# Patient Record
Sex: Female | Born: 1965 | Race: White | Hispanic: No | Marital: Married | Smoking: Never smoker
Health system: Southern US, Community
[De-identification: ages and names within clinical notes are randomized; demographics above are authoritative.]

## PROBLEM LIST (undated history)

## (undated) DIAGNOSIS — R51 Headache: Secondary | ICD-10-CM

## (undated) DIAGNOSIS — R519 Headache, unspecified: Secondary | ICD-10-CM

## (undated) HISTORY — PX: ABDOMINAL HYSTERECTOMY: SHX81

## (undated) HISTORY — DX: Headache: R51

## (undated) HISTORY — DX: Headache, unspecified: R51.9

---

## 2006-12-06 ENCOUNTER — Ambulatory Visit: Payer: Self-pay | Admitting: Gastroenterology

## 2006-12-09 ENCOUNTER — Ambulatory Visit: Payer: Self-pay | Admitting: Gastroenterology

## 2006-12-09 ENCOUNTER — Encounter (INDEPENDENT_AMBULATORY_CARE_PROVIDER_SITE_OTHER): Payer: Self-pay | Admitting: *Deleted

## 2006-12-24 ENCOUNTER — Ambulatory Visit: Payer: Self-pay | Admitting: Gastroenterology

## 2006-12-24 LAB — CONVERTED CEMR LAB
Basophils Absolute: 0 10*3/uL (ref 0.0–0.1)
Basophils Relative: 0.3 % (ref 0.0–1.0)
CRP, High Sensitivity: <1 — ABNORMAL LOW (ref 0.00–5.00)
Eosinophils Absolute: 0.2 10*3/uL (ref 0.0–0.6)
Eosinophils Relative: 1.9 % (ref 0.0–5.0)
HCT: 38.6 % (ref 36.0–46.0)
Hemoglobin: 13.8 g/dL (ref 12.0–15.0)
Lymphocytes Relative: 36.9 % (ref 12.0–46.0)
MCHC: 35.7 g/dL (ref 30.0–36.0)
MCV: 88.9 fL (ref 78.0–100.0)
Monocytes Absolute: 0.6 10*3/uL (ref 0.2–0.7)
Monocytes Relative: 6.9 % (ref 3.0–11.0)
Neutro Abs: 4.2 10*3/uL (ref 1.4–7.7)
Neutrophils Relative %: 54 % (ref 43.0–77.0)
Platelets: 269 10*3/uL (ref 150–400)
RBC: 4.34 M/uL (ref 3.87–5.11)
RDW: 11.6 % (ref 11.5–14.6)
Sed Rate: 10 mm/hr (ref 0–25)
WBC: 8 10*3/uL (ref 4.5–10.5)

## 2006-12-25 ENCOUNTER — Ambulatory Visit: Payer: Self-pay | Admitting: Gastroenterology

## 2007-02-12 ENCOUNTER — Ambulatory Visit (HOSPITAL_COMMUNITY): Admission: RE | Admit: 2007-02-12 | Discharge: 2007-02-13 | Payer: Self-pay | Admitting: Obstetrics and Gynecology

## 2007-02-12 ENCOUNTER — Encounter (INDEPENDENT_AMBULATORY_CARE_PROVIDER_SITE_OTHER): Payer: Self-pay | Admitting: Specialist

## 2009-12-27 DEATH — deceased

## 2009-12-28 ENCOUNTER — Encounter: Admission: RE | Admit: 2009-12-28 | Discharge: 2009-12-28 | Payer: Self-pay | Admitting: Obstetrics and Gynecology

## 2010-12-06 ENCOUNTER — Observation Stay (HOSPITAL_COMMUNITY)
Admission: AD | Admit: 2010-12-06 | Discharge: 2010-12-07 | Payer: Self-pay | Source: Home / Self Care | Attending: Obstetrics and Gynecology | Admitting: Obstetrics and Gynecology

## 2010-12-06 DIAGNOSIS — R1032 Left lower quadrant pain: Secondary | ICD-10-CM

## 2010-12-06 DIAGNOSIS — N8353 Torsion of ovary, ovarian pedicle and fallopian tube: Secondary | ICD-10-CM

## 2010-12-11 LAB — CBC
HCT: 40.7 % (ref 36.0–46.0)
HCT: 35.1 % — ABNORMAL LOW (ref 36.0–46.0)
Hemoglobin: 14.7 g/dL (ref 12.0–15.0)
Hemoglobin: 12.8 g/dL (ref 12.0–15.0)
MCH: 31.3 pg (ref 26.0–34.0)
MCH: 31.8 pg (ref 26.0–34.0)
MCHC: 36.1 g/dL — ABNORMAL HIGH (ref 30.0–36.0)
MCHC: 36.5 g/dL — ABNORMAL HIGH (ref 30.0–36.0)
MCV: 86.6 fL (ref 78.0–100.0)
MCV: 87.1 fL (ref 78.0–100.0)
Platelets: 222 10*3/uL (ref 150–400)
Platelets: 197 10*3/uL (ref 150–400)
RBC: 4.7 MIL/uL (ref 3.87–5.11)
RBC: 4.03 MIL/uL (ref 3.87–5.11)
RDW: 12.1 % (ref 11.5–15.5)
RDW: 12.2 % (ref 11.5–15.5)
WBC: 13.7 10*3/uL — ABNORMAL HIGH (ref 4.0–10.5)
WBC: 13.8 10*3/uL — ABNORMAL HIGH (ref 4.0–10.5)

## 2010-12-11 LAB — COMPREHENSIVE METABOLIC PANEL
ALT: 18 U/L (ref 0–35)
AST: 21 U/L (ref 0–37)
Albumin: 4.8 g/dL (ref 3.5–5.2)
Alkaline Phosphatase: 62 U/L (ref 39–117)
BUN: 8 mg/dL (ref 6–23)
CO2: 26 mEq/L (ref 19–32)
Calcium: 9.9 mg/dL (ref 8.4–10.5)
Chloride: 104 mEq/L (ref 96–112)
Creatinine, Ser: 0.81 mg/dL (ref 0.4–1.2)
GFR calc Af Amer: >60 mL/min (ref >60)
GFR calc non Af Amer: >60 mL/min (ref >60)
Glucose, Bld: 113 mg/dL — ABNORMAL HIGH (ref 70–99)
Potassium: 4.3 mEq/L (ref 3.5–5.1)
Sodium: 137 mEq/L (ref 135–145)
Total Bilirubin: 1 mg/dL (ref 0.3–1.2)
Total Protein: 8 g/dL (ref 6.0–8.3)

## 2010-12-11 LAB — URINALYSIS, ROUTINE W REFLEX MICROSCOPIC
Bilirubin Urine: NEGATIVE
Hgb urine dipstick: NEGATIVE
Ketones, ur: 15 mg/dL — AB
Nitrite: NEGATIVE
Protein, ur: NEGATIVE mg/dL
Specific Gravity, Urine: 1.025 (ref 1.005–1.030)
Urine Glucose, Fasting: NEGATIVE mg/dL
Urobilinogen, UA: 0.2 mg/dL (ref 0.0–1.0)
pH: 6 (ref 5.0–8.0)

## 2010-12-19 NOTE — Discharge Summary (Signed)
  NAMEMarland Kitchen  Pam Diaz, Pam Diaz NO.:  [id number]  MEDICAL RECORD NO.:  [id number]          PATIENT TYPE:  OBV  LOCATION:  9304                          FACILITY:  [facility]  PHYSICIAN:  Guy Sandifer. Henderson Cloud, M.D. DATE OF BIRTH:  1966-02-04  DATE OF ADMISSION:  12/06/2010 DATE OF DISCHARGE:  12/07/2010                              DISCHARGE SUMMARY   ADMITTING DIAGNOSIS:  Left lower quadrant pain.  DISCHARGE DIAGNOSES: 1. Left lower quadrant pain. 2. Left ovarian cyst.  REASON FOR ADMISSION:  This patient is a 45 year old married white female status post LAVH in 2008, who had an abrupt onset of left lower quadrant pain in the early a.m. of December 06, 2010.  She presented to the office with severe left lower quadrant pain.  She was transferred to [facility].  Vital signs were stable.  She was afebrile. Hemoglobin was 14.7.  White count 13.7.  Metabolic panel was within normal limits and the urinalysis was normal as well.  Ultrasound revealed positive blood flow to both ovaries.  There was a question of relatively less flow to the left ovary although there was no absence of flow noted.  She was admitted overnight for observation and pain medication.  HOSPITAL COURSE:  The patient was given pain medication upon initial presentation to [facility].  She required no further pain medicines throughout her stay.  She was observed until about lunchtime the following day, at which time she was comfortable, tolerating a regular diet, passing flatus.  Her abdomen was soft with only minimal tenderness in the left lower quadrant with no masses or rebound noted. Repeat CBC on December 07, 2010, revealed a white count of 13.6, hemoglobin of 12.8, and she remained afebrile.  CONDITION ON DISCHARGE:  Good.  DIET:  Regular as tolerated.  ACTIVITY:  No heavy lifting.  She is to call the office for problems including recurrent severe pain, nausea, vomiting, or  fever.  MEDICATIONS: 1. Vicodin #20, 1-2 p.o. q.6 hours p.r.n. 2. Ibuprofen 600 mg q.6 hours p.r.n.  FOLLOWUP:  Followup is in the office in 3-4 weeks.     Guy Sandifer Henderson Cloud, M.D.     JET/MEDQ  D:  12/13/2010  T:  12/13/2010  Job:  [id number]  Electronically Signed by Harold Hedge M.D. on 12/19/2010 08:47:17 AM

## 2010-12-28 ENCOUNTER — Other Ambulatory Visit (HOSPITAL_COMMUNITY): Payer: Self-pay

## 2011-01-02 ENCOUNTER — Ambulatory Visit (HOSPITAL_COMMUNITY)
Admission: RE | Admit: 2011-01-02 | Payer: BC Managed Care – PPO | Source: Home / Self Care | Admitting: Obstetrics and Gynecology

## 2011-04-13 NOTE — Assessment & Plan Note (Signed)
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 Henderson Cloud for evaluation of lower  abdominal pain.   HISTORY OF PRESENT ILLNESS:  Pam Diaz has been in good health all her  life without serious medical or surgical problems.  For the last 5 years  she has had rather classic irritable bowel syndrome with alternating  diarrhea and constipation with crampy lower abdominal pain.  This has  been more severe over the last several months and she has had almost  daily crampy lower abdominal pain, mostly with constipation.  She has  had no rectal bleeding, nausea and vomiting, or systemic complaints such  as arthritis, skin rash, joint pains, etc.  She has never had barium  studies or endoscopic exams.  She did have an umbilical hernia repair in  1998.   She recently saw Dr. Henderson Cloud and apparently was felt to possibly have  endometriosis and scheduled for pelvic ultrasound in February.  Laboratory data was checked in his office, which is not available at  this time.   The patient follows a regular diet and has no specific food  intolerances.  She does have some mild nausea and occasionally has  tenesmus and rectal spasms.  She denies abuse of NSAIDs or other  antiinflammatories.   PAST MEDICAL HISTORY:  Otherwise noncontributory.   FAMILY HISTORY:  Remarkable for diabetes in her grandmother, otherwise  unremarkable.   MEDICATIONS:  None except multivitamins.  She denies drug allergies.   SOCIAL HISTORY:  She is married and lives with her husband and three  children.  She has a degree in accounting.  She does not smoke or use  ethanol.   REVIEW OF SYSTEMS:  Noncontributory except for some  nonspecific  occasional night sweats.  She denies any rheumatologic symptoms or  symptoms of collagen vascular disease.  She has had no upper GI or  hepatobiliary complaints.   EXAMINATION:  GENERAL:  Shows her to be an attractive, healthy-appearing  white female appearing her stated age.  She is 5 feet 7 inches tall and  weighs 180 pounds.  Blood pressure is 120/70 and pulse is 65 and  regular.  There is no thyromegaly or lymphadenopathy noted.  I could not  appreciate stigmata of chronic liver disease.  CHEST:  Entirely clear and she was in a regular rhythm without murmurs,  gallops or rubs.  ABDOMEN:  Exam showed no organomegaly, masses, tenderness, or  distention.  Bowel sounds were normal.  PERIPHERAL EXTREMITIES:  Unremarkable.  RECTUM:  Inspection was unremarkable without fissures or fistulae.  Rectal exam showed no masses or tenderness.  Stool was trace guaiac  positive.   ASSESSMENT:  I think that there is a good chance this patient has  endometrial involvement of sigmoid colon accounting for rather severe  crampy lower abdominal pain and  change of bowel habits.  She gives a  long history of symptoms most consistent with irritable bowel syndrome,  and it certainly possible she has underlying inflammatory bowel disease.   RECOMMENDATIONS:  I have gone ahead and set Pam Diaz up for colonoscopy  exam next week at her convenience.  I have given her some  anticholinergic-antispasmodic medicine to use in the interim, along with  some fiber supplements.  I will ask Dr. Henderson Cloud to send [place] a copy of  recent laboratory data.  We may need to move her pelvic ultrasound exam  up sooner depending on her colonoscopy results.     Vania Rea. Jarold Motto, MD, Caleen Essex, FAGA  Electronically Signed    DRP/MedQ  DD: 12/06/2006  DT: 12/06/2006  Job #: [id number]   cc:   Guy Sandifer. Henderson Cloud, M.D.

## 2011-04-13 NOTE — H&P (Signed)
Pam Diaz, Pam Diaz NO.:  [id number]   MEDICAL RECORD NO.:  [id number]          PATIENT TYPE:  AMB   LOCATION:  SDC                           [facility]ILITY:  [facility]   PHYSICIAN:  Guy Sandifer. Henderson Cloud, M.D. DATE OF BIRTH:  07-22-1966   DATE OF ADMISSION:  02/12/2007  DATE OF DISCHARGE:                              HISTORY & PHYSICAL   CHIEF COMPLAINT:  Heavy, painful menses.   HISTORY OF PRESENT ILLNESS:  This patient is a 45 year old married white  female, G4, P3, husband status post vasectomy, with increasingly heavy  and painful menses.  Her pain begins several days before the menses  accompanied by low back pain, loose stools and cramping.  It is getting  worse each month.  Her flow is becoming heavier and she will have 2-3  days of cycle changing a tampon every hour.  Ultrasound in my office on  December 30, 2006, revealed the uterus to measure 10.0 x 5.8 x 6.4 cm  with multiple polypoid masses in the endometrial cavity ranging from 5  to 14 mm.  After discussing options of management, the patient is being  admitted for laparoscopically-assisted vaginal hysterectomy and removal  of one or both ovaries if distinctly abnormal.  The potential risks and  complications of the procedure have been discussed preoperatively.   PAST MEDICAL HISTORY:  Negative.   PAST SURGICAL HISTORY:  Umbilical hernia repair.   OBSTETRICAL HISTORY:  Vaginal delivery x3, miscarriage x1.   MEDICATIONS:  None.   No known drug allergies   SOCIAL HISTORY:  Denies tobacco, alcohol or drug abuse.   FAMILY HISTORY:  Diabetes in grandmother.  High blood pressure in  father.  Reflux disease in her father.  Anemia in sister.  Endometriosis  in sister.   REVIEW OF SYSTEMS:  NEUROLOGIC:  Denies headache.  CARDIAC:  No chest  pain.  PULMONARY:  No shortness of breath.  GI:  Denies recent changes  in bowel habits.   PHYSICAL EXAMINATION:  VITAL SIGNS:  Height 5 feet 6-3/4 inches, weight  181.4  pounds.  Blood pressure 110/80.  HEENT:  Without thyromegaly.  LUNGS:  Clear to auscultation.  HEART: Regular rate and rhythm.  BACK:  Without CVA tenderness.  BREASTS:  Without masses, retraction or discharge.  ABDOMEN:  Soft, nontender, without masses.  PELVIC:  The uterus is 8 weeks in size, mobile, nontender.  Adnexa  nontender without masses.  EXTREMITIES:  Grossly within normal limits.  NEUROLOGIC:  Grossly within normal limits.   ASSESSMENT:  Dysmenorrhea and menorrhagia.   PLAN:  Laparoscopically-assisted vaginal hysterectomy and removal of one  or both ovaries if abnormal.      Guy Sandifer. Henderson Cloud, M.D.  Electronically Signed     JET/MEDQ  D:  02/04/2007  T:  02/04/2007  Job:  [id number]

## 2011-04-13 NOTE — Assessment & Plan Note (Signed)
[facility]                         GASTROENTEROLOGY OFFICE NOTE   Pam Diaz, Pam Diaz                    MRN:          [id number]  DATE:12/24/2006                            DOB:          05-17-1966    Pam Diaz continued with abdominal pain in various quadrants of a spasmodic  type nature with gas and bloating but denies bowel irregularity.  She  has a long history of IBS and had a negative colonoscopy on December 09, 2006.   Vital signs and abdominal exam are today entirely normal.   ASSESSMENT:  This patient has irritable bowel syndrome and perhaps  pelvic endometriosis.   RECOMMENDATIONS:  1. P.R.N. sublingual Levsin 1.25 mg every 6 to 8 hours.  2. Upper abdominal ultrasound exam.  3. P.R.N. GI followup as needed.     Vania Rea. Jarold Motto, MD, Caleen Essex, FAGA  Electronically Signed    DRP/MedQ  DD: 12/24/2006  DT: 12/24/2006  Job #: [id number]   cc:   Guy Sandifer. Henderson Cloud, M.D.

## 2011-04-13 NOTE — Op Note (Signed)
NAMEKIELEY, AKTER NO.:  [id number]   MEDICAL RECORD NO.:  [id number]          PATIENT TYPE:  AMB   LOCATION:  SDC                           [facility]  [facility]   PHYSICIAN:  Guy Sandifer. Henderson Cloud, M.D. DATE OF BIRTH:  09/10/1966   DATE OF PROCEDURE:  02/12/2007  DATE OF DISCHARGE:                               OPERATIVE REPORT   PREOPERATIVE DIAGNOSIS:  Menorrhagia.   POSTOPERATIVE DIAGNOSIS:  Menorrhagia.   PROCEDURE:  Laparoscopically-assisted vaginal hysterectomy.   SURGEON:  Harold Hedge, M.D.   ASSISTANT:  Zelphia Cairo, M.D.   ANESTHESIA:  General with endotracheal intubation.   SPECIMENS:  Uterus sent to pathology.   ESTIMATED BLOOD LOSS:  250 mL.   INDICATIONS AND CONSENT:  This patient is a 45 year old married white  female, G4, P3, husband status post vasectomy, with increasingly heavy  and painful menses.  Details are dictated in the history and physical.  Laparoscopically-assisted vaginal hysterectomy with removal of one or  both ovaries if abnormal has been discussed preoperatively.  Potential  risks and complications were reviewed preoperatively including but not  limited to infection, bowel, bladder, ureteral damage, bleeding  requiring transfusion of blood products and possible transfusion  reaction, HIV and hepatitis acquisition, DVT, PE, pneumonia, fistula  formation, laparotomy, postoperative dyspareunia.  All questions have  been answered, and consent is signed on the chart.   FINDINGS:  Upper abdomen is grossly normal.  Uterus is about 6 weeks in  size, smooth in contour.  Anterior and posterior cul-de-sacs were  normal.  Tubes and ovaries normal bilaterally.   PROCEDURE:  The patient is taken to the operating room where she is  identified and placed in dorsal supine position.  General anesthesia is  induced via endotracheal intubation.  She is then placed in the dorsal  lithotomy position where she is prepped abdominally and  vaginally.  Hulka tenaculum is placed in the uterus as a manipulator.  She is  straight catheterized and draped in a sterile fashion.  The  infraumbilical areas and the suprapubic areas are infiltrated with 1/2%  plain Marcaine.  The skin over the previous infraumbilical incision is  elevated with 2 Allis clamps, and the skin is incised infraumbilically.  Dissection is carried out layers to the peritoneum which is bluntly  entered without difficulty.  Anchoring sutures of 0 Vicryl are placed at  the 3 and 9 o'clock positions.  A disposable Hasson trocar sleeve is  placed and anchored into place with the 0 Vicryl sutures.  Pneumoperitoneum is introduced.  Small suprapubic incision is made, and  a 5-mm XL bladeless disposable trocar sleeve is placed under direct  visualization without difficulty.  The above findings are noted.  Then  using the gyrus bipolar cautery cutting instrument, the proximal  ligaments are taken down bilaterally to the level of the vesicouterine  peritoneum.  Good hemostasis is maintained.  Vesicouterine peritoneum is  taken down cephalolaterally as well.  Good hemostasis is noted.  Excess  fluid is removed.  The suprapubic trocar sleeve is removed, and  attention is turned to the vagina.  The posterior  cul-de-sac is entered  sharply, and the cervix is circumscribed with the unipolar cautery.  Mucosa is advanced sharply and bluntly.  Anterior cul-de-sacs entered  without difficulty.  Then, using the gyrus bipolar cautery instrument,  uterosacral ligaments are taken bilaterally followed by the bladder  pillars, cardinal ligaments and the uterine vessels.  Specimen is  delivered posteriorly, proximal ligaments are taken down and the  specimen is delivered off the field.  All sutures will be 0 Monocryl  unless otherwise designated.  Uterosacral ligaments are plicated vaginal  cuff bilaterally.  They are then plicated in the midline with a third  suture.  Cuff is closed  with figure-of-eights.  Foley catheter is placed  in the bladder, and clear urine is noted.  Attention is returned the  abdomen.  Pneumoperitoneum is reintroduced.  Under direct visualization,  the 5-mm bladeless trocar sleeve is replaced through the suprapubic  incision.  Minor bleeding is controlled with bipolar cautery.  Inspection under reduced pneumoperitoneum reveals good hemostasis.  Excess fluid is removed.  All instruments are removed.  The fascia at  the umbilical incision is closed by tying the anchoring sutures together  the midline.  In addition, additional 0 Vicryl sutures are placed under  good visualization to completely close the fascial defect.  The skin of  the umbilical incision is closed with subcuticular 3-0 Vicryl suture.  Dermabond is used on both umbilical and suprapubic incisions.  All  counts correct.  The patient is awakened, taken to the recovery room in  stable condition.      Guy Sandifer Henderson Cloud, M.D.  Electronically Signed     JET/MEDQ  D:  02/12/2007  T:  02/12/2007  Job:  [id number]

## 2012-09-05 IMAGING — US US TRANSVAGINAL NON-OB
1 series · 13 of 25 positions shown · non-contrast
Comparison: None.

CLINICAL DATA: Acute onset of severe left pelvic pain.  Nausea
vomiting.  Previous hysterectomy.

TRANSVAGINAL ULTRASOUND OF PELVIS
DOPPLER ULTRASOUND OF OVARIES
TECHNIQUE: Transvaginal ultrasound examination of the pelvis were
performed including evaluation of the uterus, ovaries, adnexal
regions, and pelvic cul-de-sac.  Color and duplex Doppler
ultrasound was utilized to evaluate blood flow to the ovaries.

[Series 1: us transvaginal non-ob · 42 acquisitions, 13 frames shown]
[im 1/42]
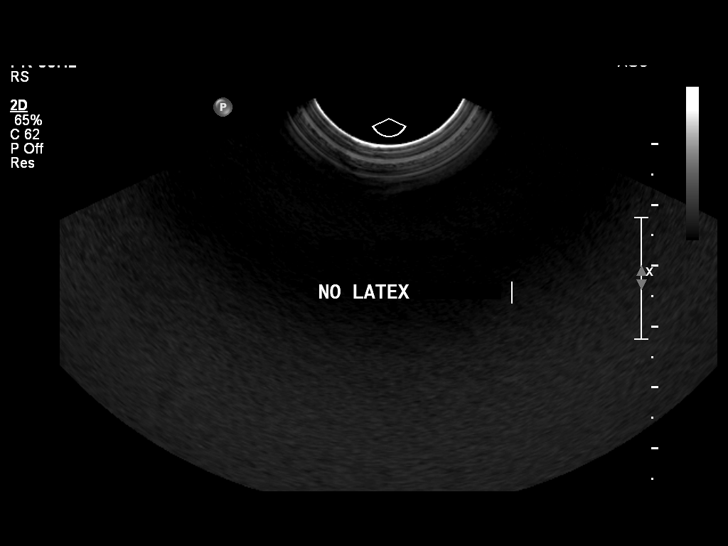
[im 4/42]
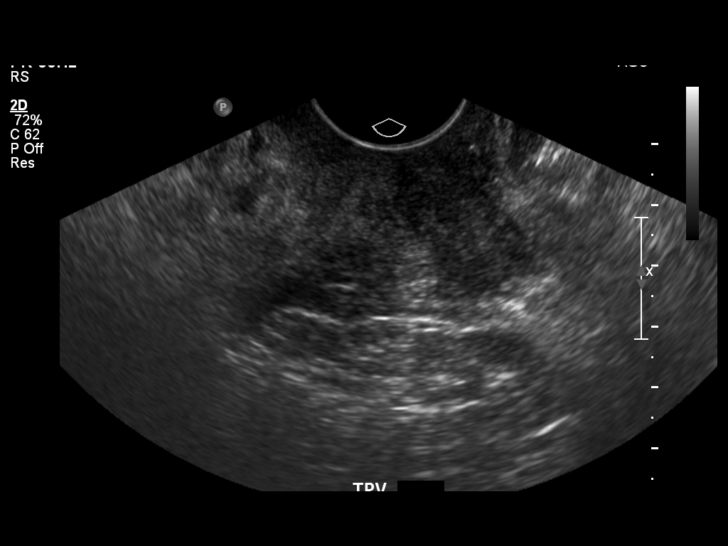
[im 7/42]
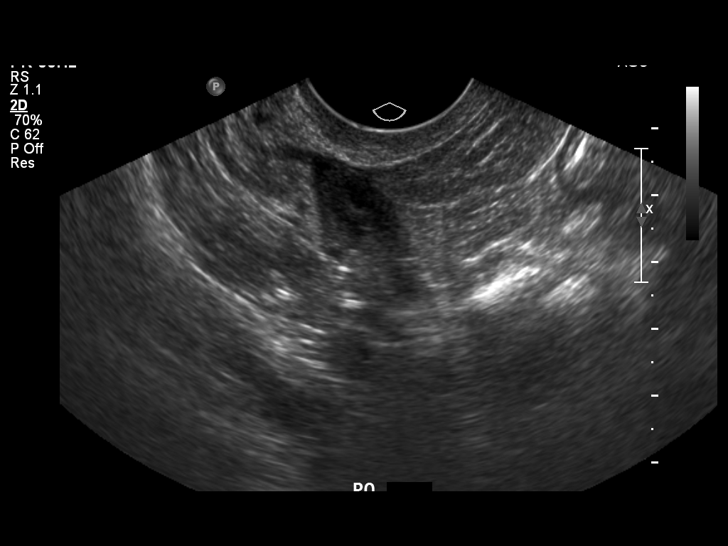
[im 11/42]
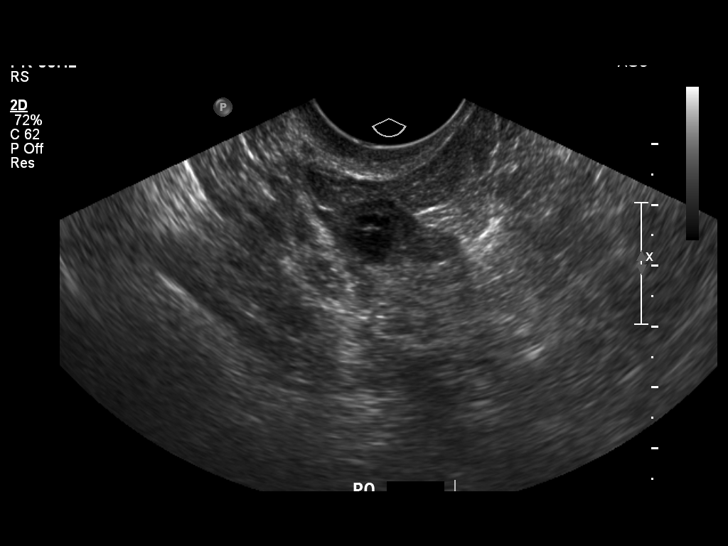
[im 14/42]
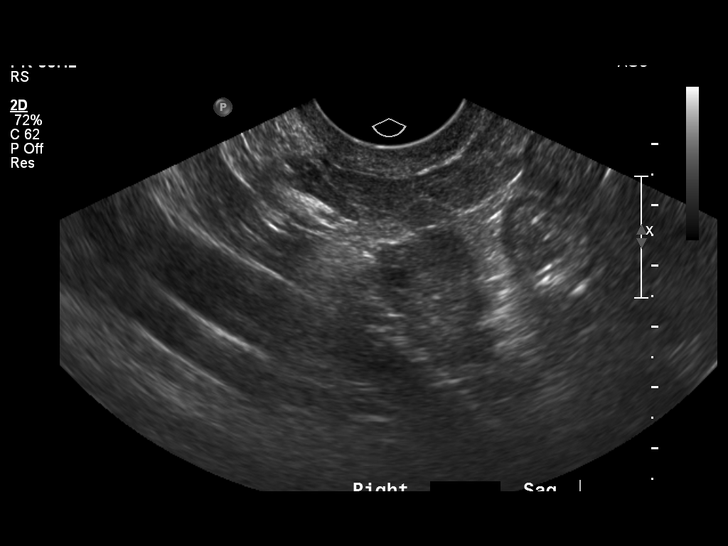
[im 18/42]
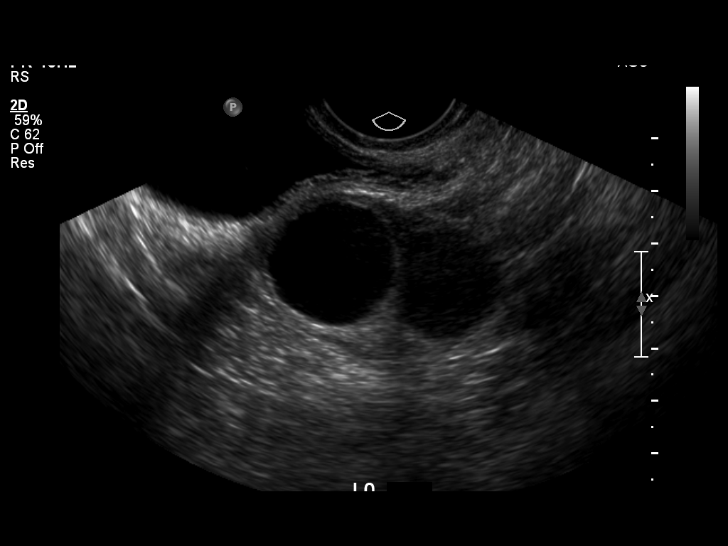
[im 21/42]
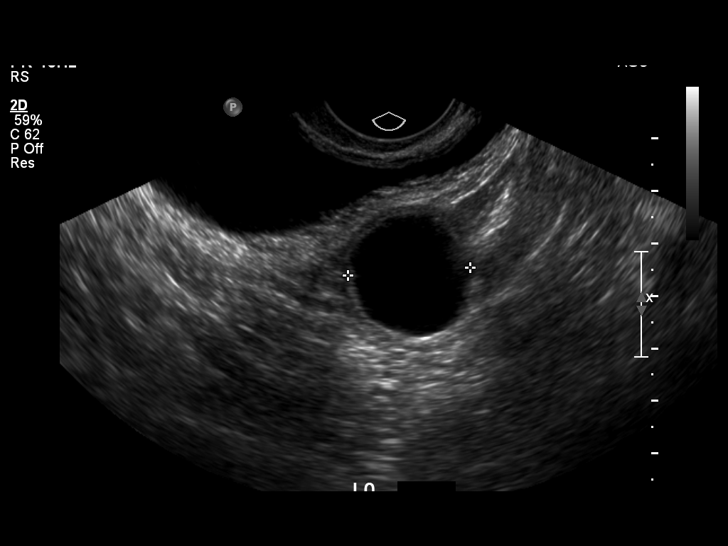
[im 24/42]
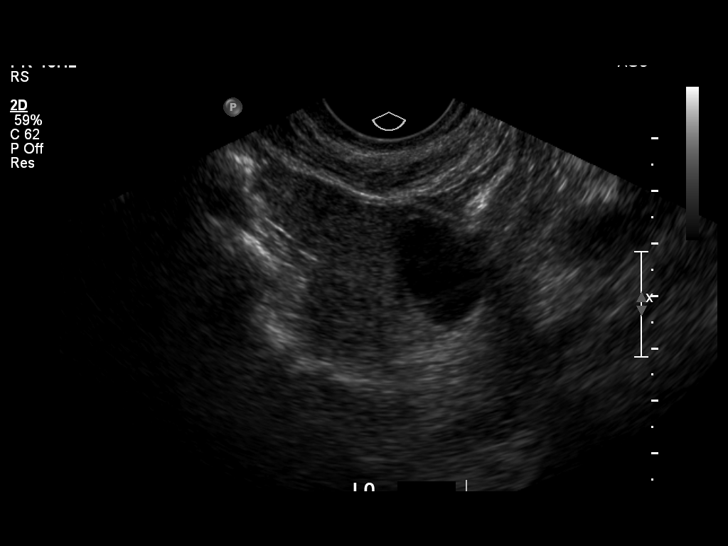
[im 28/42]
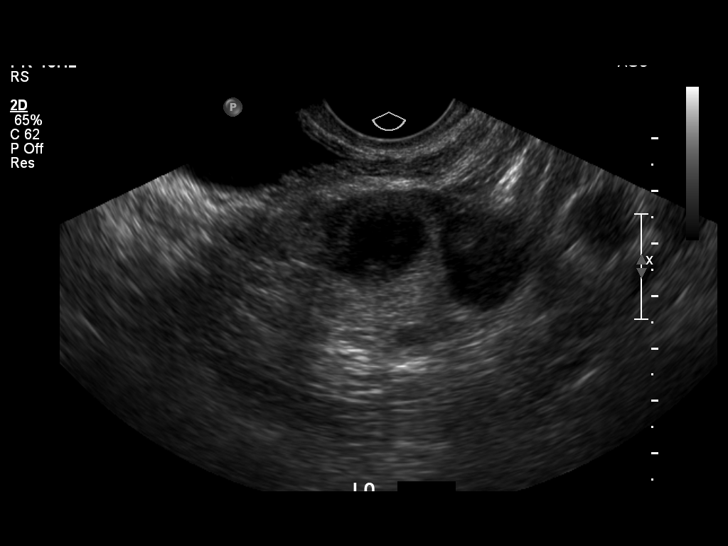
[im 31/42]
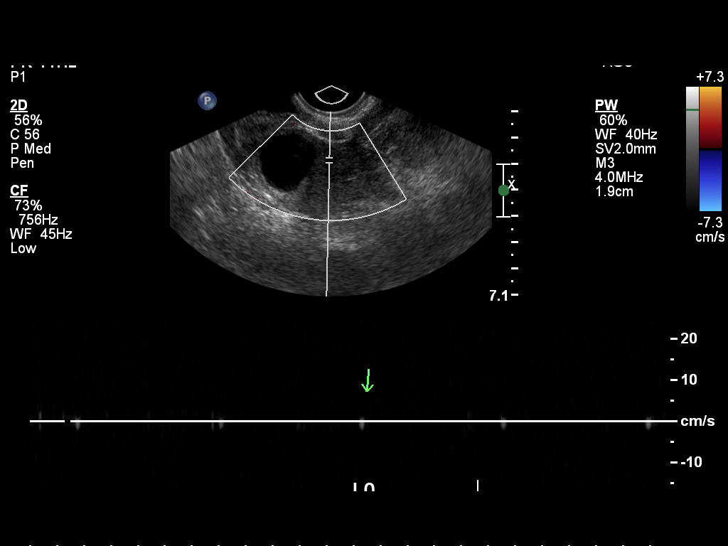
[im 35/42]
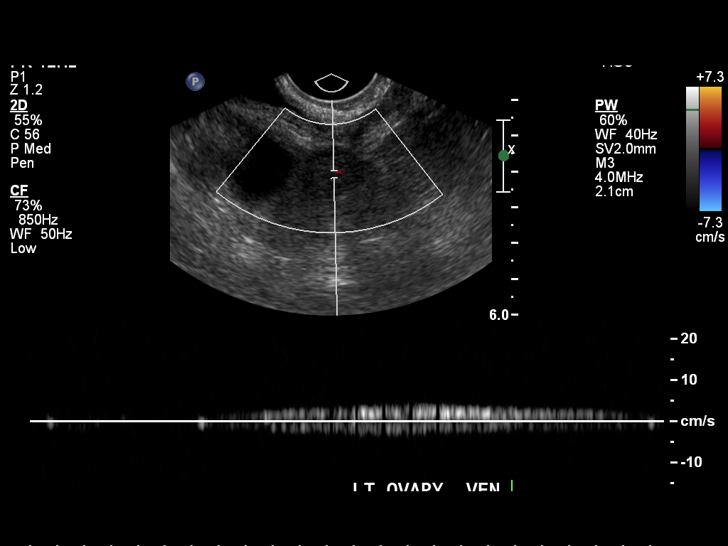
[im 38/42]
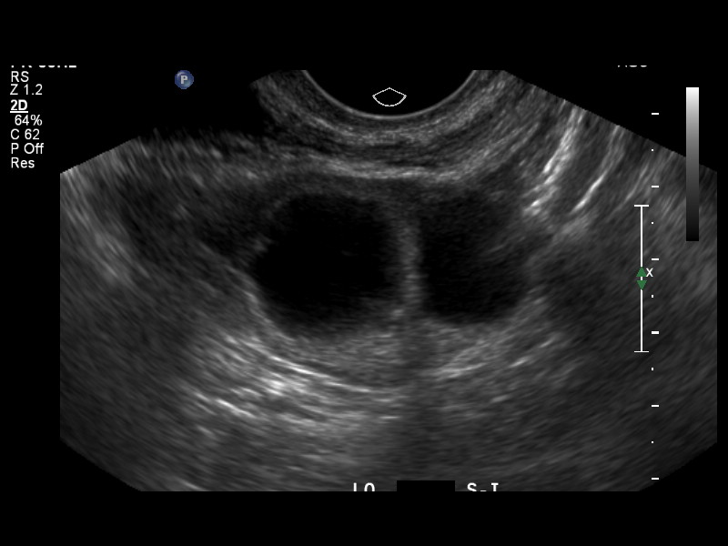
[im 42/42]
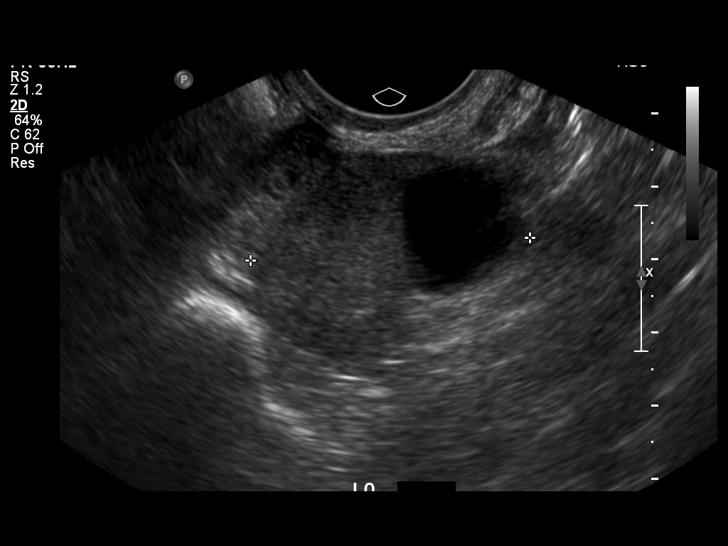

[13 of 25 positions shown; findings below may reference images not displayed]

FINDINGS: Uterus is surgically absent.  Vaginal cuff is unremarkable in
appearance.

Right Ovary measures 2.3 x 1.2 x 1.3 cm.  Normal appearance.

Left Ovary measures 4.9 x 2.9 x 3.8 cm.   Two simple appearing
follicles which measure 2.5 cm and 2.2 cm in maximum diameter.

Other Findings:  No other abnormality identified.

Doppler Evaluation:  Color Doppler evaluation shows some blood flow
is present within both ovaries, but appears decreased in the left
ovary compared the right.

Spectral Doppler evaluation shows normal arterial and venous
waveform patterns in the right ovary.  Doppler wave forms were more
difficult to obtain from the left ovary; wave forms from the left
ovary show decreased diastolic flow and venous flow when compared
to those from the right ovary.
IMPRESSION: 1.  Enlarged left ovary, with two left ovarian cysts measuring up
to 2.5 cm.  No evidence of pelvic mass or free fluid.
2.  Color and duplex Doppler evaluation show decreased blood flow
in the left ovary compared to the right;  partial or intermittent
ovarian torsion cannot be excluded.

Critical test results telephoned to the patient's ROLAMDO nurse Ntp-Dafina
at the time of interpretation on 12/06/2010 at 4177 hours.

## 2016-07-17 ENCOUNTER — Emergency Department (HOSPITAL_COMMUNITY)
Admission: EM | Admit: 2016-07-17 | Discharge: 2016-07-18 | Disposition: A | Discharge status: Home / Self Care | Payer: 59 | Attending: Emergency Medicine | Admitting: Emergency Medicine

## 2016-07-17 ENCOUNTER — Emergency Department (HOSPITAL_COMMUNITY): Payer: 59

## 2016-07-17 ENCOUNTER — Encounter (HOSPITAL_COMMUNITY): Payer: Self-pay

## 2016-07-17 ENCOUNTER — Encounter (HOSPITAL_COMMUNITY): Payer: Self-pay | Admitting: *Deleted

## 2016-07-17 ENCOUNTER — Ambulatory Visit (HOSPITAL_COMMUNITY)
Admission: EM | Admit: 2016-07-17 | Discharge: 2016-07-17 | Disposition: A | Discharge status: Nursing Home (Includes ICF) | Payer: 59

## 2016-07-17 DIAGNOSIS — Z5181 Encounter for therapeutic drug level monitoring: Secondary | ICD-10-CM | POA: Insufficient documentation

## 2016-07-17 DIAGNOSIS — G4452 New daily persistent headache (NDPH): Secondary | ICD-10-CM | POA: Diagnosis not present

## 2016-07-17 DIAGNOSIS — Z791 Long term (current) use of non-steroidal anti-inflammatories (NSAID): Secondary | ICD-10-CM | POA: Diagnosis not present

## 2016-07-17 DIAGNOSIS — R519 Headache, unspecified: Secondary | ICD-10-CM

## 2016-07-17 DIAGNOSIS — R51 Headache: Secondary | ICD-10-CM | POA: Diagnosis not present

## 2016-07-17 LAB — DIFFERENTIAL
Basophils Absolute: 0 10*3/uL (ref 0.0–0.1)
Basophils Relative: 0 %
EOS ABS: 0.2 10*3/uL (ref 0.0–0.7)
EOS PCT: 2 %
LYMPHS ABS: 2.8 10*3/uL (ref 0.7–4.0)
Lymphocytes Relative: 34 %
MONO ABS: 0.5 10*3/uL (ref 0.1–1.0)
Monocytes Relative: 6 %
NEUTROS PCT: 58 %
Neutro Abs: 4.7 10*3/uL (ref 1.7–7.7)

## 2016-07-17 LAB — PROTIME-INR
INR: 0.94
PROTHROMBIN TIME: 12.5 s (ref 11.4–15.2)

## 2016-07-17 LAB — CBC
HCT: 43.8 % (ref 36.0–46.0)
HEMOGLOBIN: 15.1 g/dL — AB (ref 12.0–15.0)
MCH: 30.8 pg (ref 26.0–34.0)
MCHC: 34.5 g/dL (ref 30.0–36.0)
MCV: 89.4 fL (ref 78.0–100.0)
PLATELETS: 245 10*3/uL (ref 150–400)
RBC: 4.9 MIL/uL (ref 3.87–5.11)
RDW: 12.2 % (ref 11.5–15.5)
WBC: 8.2 10*3/uL (ref 4.0–10.5)

## 2016-07-17 LAB — COMPREHENSIVE METABOLIC PANEL
ALBUMIN: 4.7 g/dL (ref 3.5–5.0)
ALT: 32 U/L (ref 14–54)
ANION GAP: 8 (ref 5–15)
AST: 23 U/L (ref 15–41)
Alkaline Phosphatase: 85 U/L (ref 38–126)
BILIRUBIN TOTAL: 0.8 mg/dL (ref 0.3–1.2)
BUN: 10 mg/dL (ref 6–20)
CALCIUM: 10.2 mg/dL (ref 8.9–10.3)
CO2: 26 mmol/L (ref 22–32)
Chloride: 105 mmol/L (ref 101–111)
Creatinine, Ser: 0.93 mg/dL (ref 0.44–1.00)
GLUCOSE: 97 mg/dL (ref 65–99)
POTASSIUM: 4.3 mmol/L (ref 3.5–5.1)
Sodium: 139 mmol/L (ref 135–145)
TOTAL PROTEIN: 8.1 g/dL (ref 6.5–8.1)

## 2016-07-17 LAB — APTT: aPTT: 23 seconds — ABNORMAL LOW (ref 24–36)

## 2016-07-17 LAB — I-STAT CHEM 8, ED
BUN: 12 mg/dL (ref 6–20)
CALCIUM ION: 1.26 mmol/L (ref 1.13–1.30)
Chloride: 104 mmol/L (ref 101–111)
Creatinine, Ser: 0.9 mg/dL (ref 0.44–1.00)
Glucose, Bld: 101 mg/dL — ABNORMAL HIGH (ref 65–99)
HEMATOCRIT: 44 % (ref 36.0–46.0)
HEMOGLOBIN: 15 g/dL (ref 12.0–15.0)
Potassium: 4.7 mmol/L (ref 3.5–5.1)
SODIUM: 142 mmol/L (ref 135–145)
TCO2: 28 mmol/L (ref 0–100)

## 2016-07-17 LAB — I-STAT TROPONIN, ED: TROPONIN I, POC: 0 ng/mL (ref 0.00–0.08)

## 2016-07-17 MED ORDER — METOCLOPRAMIDE HCL 5 MG/ML IJ SOLN
10.0000 mg | Freq: Once | INTRAMUSCULAR | Status: DC
  Filled 2016-07-17: qty 2

## 2016-07-17 MED ORDER — KETOROLAC TROMETHAMINE 15 MG/ML IJ SOLN
15.0000 mg | Freq: Once | INTRAMUSCULAR | Status: DC
  Filled 2016-07-17: qty 1

## 2016-07-17 MED ORDER — IOPAMIDOL (ISOVUE-370) INJECTION 76%
INTRAVENOUS | Status: AC
  Administered 2016-07-17: 50 mL
  Filled 2016-07-17: qty 50

## 2016-07-17 MED ORDER — SODIUM CHLORIDE 0.9 % IV BOLUS (SEPSIS)
1000.0000 mL | Freq: Once | INTRAVENOUS | Status: AC
  Administered 2016-07-18: 1000 mL via INTRAVENOUS

## 2016-07-17 NOTE — ED Notes (Signed)
Report  Phoned   To   Albin Fellingarla tiage  At  [facility]

## 2016-07-17 NOTE — ED Triage Notes (Signed)
Pt reports this is the "worse headache of her life".  She had a headache several months ago that lasted several weeks but it was not has severe at this time.  Pt reports laying flat seems to help decrease pain slightly.

## 2016-07-17 NOTE — ED Triage Notes (Signed)
Pt  Reports  Waves  Of  Sharp  Pain   X  1   Week   With    Nausea     Worst  Headache  Of  Her life     Not  On  Any  bp  meds    Just left the  Eye  Dr   Sitting  Upright on the  Exam table  Speaking in  Complete  sentances

## 2016-07-17 NOTE — ED Provider Notes (Signed)
[facility] Provider Note   CSN: [id number] Arrival date & time: 07/17/16  1323     History   Chief Complaint Chief Complaint  Patient presents with  . Headache    HPI [name] is a 50 y.o. female.  HPI  Pt is a 50yo white female w/ no significant PMH (not taking Rx or OTC supplements, etc) presents to the ED w/ worsening posterior H/A. Pt has had a constant posterior H/A w/o radiation, that changes in intensity to throbbing vs sharp. H/A worsens when pt sits up and is somewhat improved w/ NSAIDS and laying flat. Pt denies any o/ new or significant symptoms. She specifically denies, associated vision changes, photo/phonophobia, emesis, CP, SOB. She endorses intermittent nausea. Pt denies h/o MI, stroke, intracranial abnormalities, cancer.   History reviewed. No pertinent past medical history.  There are no active problems to display for this patient.   Past Surgical History:  Procedure Laterality Date  . ABDOMINAL HYSTERECTOMY      OB History    No data available       Home Medications    Prior to Admission medications   Medication Sig Start Date End Date Taking? Authorizing Provider  ibuprofen (ADVIL,MOTRIN) 600 MG tablet Take 1 tablet (600 mg total) by mouth every 6 (six) hours as needed. 07/18/16   Derwood KaplanAnkit Tejuan Gholson, MD    Family History History reviewed. No pertinent family history.  Social History Social History  Substance Use Topics  . Smoking status: Never Smoker  . Smokeless tobacco: Never Used  . Alcohol use No     Allergies   Review of patient's allergies indicates no known allergies.   Review of Systems Review of Systems  Constitutional: Positive for activity change.  Respiratory: Negative for shortness of breath.   Cardiovascular: Negative for chest pain.  Gastrointestinal: Negative for abdominal pain, nausea and vomiting.  Genitourinary: Negative for dysuria.  Musculoskeletal: Negative for neck pain.  Neurological:  Positive for headaches.     Physical Exam Updated Vital Signs BP 107/90   Pulse 67   Temp 98.1 F (36.7 C) (Oral)   Resp 16   Ht 5\' 7"  (1.702 m)   Wt 195 lb (88.5 kg)   SpO2 96%   BMI 30.54 kg/m   Physical Exam  Constitutional: She is oriented to person, place, and time. She appears well-developed.  HENT:  Head: Normocephalic and atraumatic.  Eyes: EOM are normal.  Neck: Normal range of motion. Neck supple.  Cardiovascular: Normal rate.   Pulmonary/Chest: Effort normal.  Abdominal: Bowel sounds are normal.  Neurological: She is alert and oriented to person, place, and time. No cranial nerve deficit. Coordination normal.  Skin: Skin is warm and dry.  Nursing note and vitals reviewed.    ED Treatments / Results  Labs (all labs ordered are listed, but only abnormal results are displayed) Labs Reviewed  APTT - Abnormal; Notable for the following:       Result Value   aPTT 23 (*)    All other components within normal limits  CBC - Abnormal; Notable for the following:    Hemoglobin 15.1 (*)    All other components within normal limits  I-STAT CHEM 8, ED - Abnormal; Notable for the following:    Glucose, Bld 101 (*)    All other components within normal limits  PROTIME-INR  DIFFERENTIAL  COMPREHENSIVE METABOLIC PANEL  I-STAT TROPOININ, ED  CBG MONITORING, ED    EKG  EKG Interpretation  Date/Time:  Tuesday July 17 2016 14:02:24 EDT Ventricular Rate:  65 PR Interval:  166 QRS Duration: 82 QT Interval:  392 QTC Calculation: 407 R Axis:   6 Text Interpretation:  Normal sinus rhythm Nonspecific T wave abnormality Abnormal ECG No old tracing to compare Confirmed by [facility] MD, Barbara Cower [id number]) on 07/17/2016 2:09:15 PM       Radiology Ct Angio Head W Or Wo Contrast  Result Date: 07/18/2016 CLINICAL DATA:  50 y/o F; severe right posterior headache radiating to the front. High blood pressure. EXAM: CT ANGIOGRAPHY HEAD AND NECK TECHNIQUE: Multidetector CT imaging of  the head and neck was performed using the standard protocol during bolus administration of intravenous contrast. Multiplanar CT image reconstructions and MIPs were obtained to evaluate the vascular anatomy. Carotid stenosis measurements (when applicable) are obtained utilizing NASCET criteria, using the distal internal carotid diameter as the denominator. CONTRAST:  50 cc Isovue 370 COMPARISON:  CT head 07/17/2016. FINDINGS: CTA NECK Aortic arch: 3 vessel arch.  Patent great vessels. Right carotid system: No occlusion, aneurysm, dissection, or significant stenosis is identified. Left carotid system: No occlusion, aneurysm, dissection, or significant stenosis is identified. Vertebral arteries:No occlusion, aneurysm, dissection, or significant stenosis is identified. Skeleton: No acute osseous abnormality is identified. Other neck: Salivary glands are unremarkable. No lymphadenopathy or discrete cervical mass is identified. The thyroid gland is normal. The aerodigestive tract is patent. Upper chest: Clear lung apices. CTA HEAD Anterior circulation: Bilateral internal carotid, anterior cerebral, and middle cerebral arteries are patent. No occlusion, aneurysm, dissection, or significant stenosis is identified. Posterior circulation: Bilateral vertebral arteries, the basilar artery, and posterior cerebral arteries are patent. No occlusion, aneurysm, dissection, or significant stenosis is identified. Venous sinuses: No thrombosis is identified. Anatomic variants: Small anterior communicating artery. No posterior communicating arteries identified, hypoplastic or absent. Delayed phase: No abnormal enhancement. IMPRESSION: Normal CT angiogram of head and neck. No occlusion, aneurysm, dissection, or significant stenosis is identified. Electronically Signed   By: Mitzi Hansen M.D.   On: 07/18/2016 00:13   Ct Head Wo Contrast  Result Date: 07/17/2016 CLINICAL DATA:  Worst headache of life EXAM: CT HEAD WITHOUT  CONTRAST TECHNIQUE: Contiguous axial images were obtained from the base of the skull through the vertex without intravenous contrast. COMPARISON:  None. FINDINGS: Brain: Normal brain. Negative for infarct or mass. No hemorrhage. Ventricle size normal. Vascular: Negative Skull: Negative Sinuses/Orbits: Negative Other: Negative IMPRESSION: Negative Electronically Signed   By: Marlan Palau M.D.   On: 07/17/2016 15:00   Ct Angio Neck W And/or Wo Contrast  Result Date: 07/18/2016 CLINICAL DATA:  50 y/o F; severe right posterior headache radiating to the front. High blood pressure. EXAM: CT ANGIOGRAPHY HEAD AND NECK TECHNIQUE: Multidetector CT imaging of the head and neck was performed using the standard protocol during bolus administration of intravenous contrast. Multiplanar CT image reconstructions and MIPs were obtained to evaluate the vascular anatomy. Carotid stenosis measurements (when applicable) are obtained utilizing NASCET criteria, using the distal internal carotid diameter as the denominator. CONTRAST:  50 cc Isovue 370 COMPARISON:  CT head 07/17/2016. FINDINGS: CTA NECK Aortic arch: 3 vessel arch.  Patent great vessels. Right carotid system: No occlusion, aneurysm, dissection, or significant stenosis is identified. Left carotid system: No occlusion, aneurysm, dissection, or significant stenosis is identified. Vertebral arteries:No occlusion, aneurysm, dissection, or significant stenosis is identified. Skeleton: No acute osseous abnormality is identified. Other neck: Salivary glands are unremarkable. No lymphadenopathy or discrete cervical mass is identified. The thyroid gland is  normal. The aerodigestive tract is patent. Upper chest: Clear lung apices. CTA HEAD Anterior circulation: Bilateral internal carotid, anterior cerebral, and middle cerebral arteries are patent. No occlusion, aneurysm, dissection, or significant stenosis is identified. Posterior circulation: Bilateral vertebral arteries, the  basilar artery, and posterior cerebral arteries are patent. No occlusion, aneurysm, dissection, or significant stenosis is identified. Venous sinuses: No thrombosis is identified. Anatomic variants: Small anterior communicating artery. No posterior communicating arteries identified, hypoplastic or absent. Delayed phase: No abnormal enhancement. IMPRESSION: Normal CT angiogram of head and neck. No occlusion, aneurysm, dissection, or significant stenosis is identified. Electronically Signed   By: Mitzi HansenLance  Furusawa-Stratton M.D.   On: 07/18/2016 00:13    Procedures Procedures (including critical care time)  Medications Ordered in ED Medications  ketorolac (TORADOL) 15 MG/ML injection 15 mg (15 mg Intravenous Refused 07/17/16 1956)  metoCLOPramide (REGLAN) injection 10 mg (10 mg Intravenous Refused 07/17/16 1956)  sodium chloride 0.9 % bolus 1,000 mL (not administered)  iopamidol (ISOVUE-370) 76 % injection (50 mLs  Contrast Given 07/17/16 2331)     Initial Impression / Assessment and Plan / ED Course  I have reviewed the triage vital signs and the nursing notes.  Pertinent labs & imaging results that were available during my care of the patient were reviewed by me and considered in my medical decision making (see chart for details).  Clinical Course    Pt comes in with cc of headaches.  DDX includes: Primary headaches - including migrainous headaches, cluster headaches, tension headaches. ICH Carotid dissection Venous thrombosis Tumor Vascular headaches AV malformation Brain aneurysm Muscular headaches  A/P: Pt comes in with cc of headaches. No concerns for life threatening secondary headaches because pt has no focal neuro deficits. Headaches are posterior and non specific x 2 weeks straight. Pt is not on any meds. Family hx and social hx is benign. Pain is better when supine compared to sitting up - contrary to what would be expected to ICH.  CT angio head and neck ordered. If the  CT-A is neg, we can effectively r/o brain AN and dissections. If CT is neg, pt will be discharged with neuro f/u.   Final Clinical Impressions(s) / ED Diagnoses   Final diagnoses:  New daily persistent headache    New Prescriptions New Prescriptions   IBUPROFEN (ADVIL,MOTRIN) 600 MG TABLET    Take 1 tablet (600 mg total) by mouth every 6 (six) hours as needed.     Derwood KaplanAnkit Darrol Brandenburg, MD 07/18/16 [phone]

## 2016-07-17 NOTE — ED Provider Notes (Signed)
[facility]    CSN: [id number] Arrival date & time: 07/17/16  1207  First Provider Contact:  First MD Initiated Contact with Patient 07/17/16 1220        History   Chief Complaint Chief Complaint  Patient presents with  . Headache    HPI Pam Diaz is a 50 y.o. female.    Headache  Pain location:  Occipital Quality:  Sharp Radiates to:  Does not radiate Onset quality:  Gradual Duration:  2 weeks Progression:  Worsening (initial ha 2 mos ago resolved spont, relapsed mild 2 weeks ago but worse for past wk with becoming nonfunctional.) Chronicity:  New Similar to prior headaches: no   Context comment:  Seen by eye specialst this am with hbp discovered, no eye problem. sent for eval. Relieved by:  Nothing Associated symptoms: dizziness and nausea   Associated symptoms: no seizures and no weakness     History reviewed. No pertinent past medical history.  There are no active problems to display for this patient.   Past Surgical History:  Procedure Laterality Date  . ABDOMINAL HYSTERECTOMY      OB History    No data available       Home Medications    Prior to Admission medications   Not on File    Family History History reviewed. No pertinent family history.  Social History Social History  Substance Use Topics  . Smoking status: Never Smoker  . Smokeless tobacco: Never Used  . Alcohol use No     Allergies   Review of patient's allergies indicates no known allergies.   Review of Systems Review of Systems  Constitutional: Negative.   Eyes: Negative.  Negative for visual disturbance.  Respiratory: Negative.   Cardiovascular: Negative.   Gastrointestinal: Positive for nausea.  Genitourinary: Negative.   Neurological: Positive for dizziness and headaches. Negative for seizures, speech difficulty and weakness.  All other systems reviewed and are negative.    Physical Exam Triage Vital Signs ED Triage Vitals  Enc Vitals  Group     BP 07/17/16 1225 (!) 156/101     Pulse Rate 07/17/16 1225 72     Resp 07/17/16 1225 18     Temp 07/17/16 1225 98.5 F (36.9 C)     Temp Source 07/17/16 1225 Oral     SpO2 07/17/16 1225 95 %     Weight 07/17/16 1225 195 lb (88.5 kg)     Height 07/17/16 1225 5\' 7"  (1.702 m)     Head Circumference --      Peak Flow --      Pain Score 07/17/16 1239 8     Pain Loc --      Pain Edu? --      Excl. in GC? --    No data found.   Updated Vital Signs BP (!) 156/101 (BP Location: Right Arm)   Pulse 72   Temp 98.5 F (36.9 C) (Oral)   Resp 18   Ht 5\' 7"  (1.702 m)   Wt 195 lb (88.5 kg)   SpO2 95%   BMI 30.54 kg/m   Visual Acuity Right Eye Distance:   Left Eye Distance:   Bilateral Distance:    Right Eye Near:   Left Eye Near:    Bilateral Near:     Physical Exam  Constitutional: She is oriented to person, place, and time. She appears well-developed and well-nourished. No distress.  HENT:  Right Ear: External ear normal.  Left Ear:  External ear normal.  Mouth/Throat: Oropharynx is clear and moist.  Eyes: Conjunctivae and EOM are normal. Pupils are equal, round, and reactive to light.  Neck: Normal range of motion. Neck supple.  Cardiovascular: Normal rate.   Abdominal: Soft. Bowel sounds are normal.  Lymphadenopathy:    She has no cervical adenopathy.  Neurological: She is alert and oriented to person, place, and time. No cranial nerve deficit. Coordination normal.  Skin: Skin is warm.  Nursing note and vitals reviewed.    UC Treatments / Results  Labs (all labs ordered are listed, but only abnormal results are displayed) Labs Reviewed - No data to display  EKG  EKG Interpretation None       Radiology No results found.  Procedures Procedures (including critical care time)  Medications Ordered in UC Medications - No data to display   Initial Impression / Assessment and Plan / UC Course  I have reviewed the triage vital signs and the nursing  notes.  Pertinent labs & imaging results that were available during my care of the patient were reviewed by me and considered in my medical decision making (see chart for details).  Clinical Course  sent for occip HA eval, seen this am by ophthal no eye problem, found to have hbp.   Final Clinical Impressions(s) / UC Diagnoses   Final diagnoses:  Headache in back of head    New Prescriptions New Prescriptions   No medications on file     Linna HoffJames D Kindl, MD 07/17/16 1307

## 2016-07-17 NOTE — ED Triage Notes (Signed)
Sent by u/c. Onset 2 weeks ago headache, occipital area that is worsening,  Feels like stabbing pain. Pt went to eye specialists this morning and was discovered to have HBP.  Eyes were OK.  Associated symptoms nausea, dizziness. Taking Ibuprofen with little relief.

## 2016-07-18 MED ORDER — IBUPROFEN 600 MG PO TABS: 600.0000 mg | ORAL_TABLET | Freq: Four times a day (QID) | ORAL | 0 refills | Status: DC | PRN

## 2016-07-18 NOTE — Discharge Instructions (Signed)
We saw you in the ER for headaches. All the labs and imaging are normal. We are not sure what is causing your headaches, however, there appears to be no evidence of infection, bleeds or tumors based on our exam and results.  Please take motrin round the clock for the next 6 hours for the next 3 days. See a Neurologist from the group list that we provided.

## 2016-07-18 NOTE — ED Notes (Signed)
Pt d/c home  

## 2016-07-26 ENCOUNTER — Ambulatory Visit (INDEPENDENT_AMBULATORY_CARE_PROVIDER_SITE_OTHER): Payer: 59 | Admitting: Neurology

## 2016-07-26 ENCOUNTER — Encounter: Payer: Self-pay | Admitting: Neurology

## 2016-07-26 VITALS — BP 140/90 | HR 73 | Ht 67.0 in | Wt 202.0 lb

## 2016-07-26 DIAGNOSIS — R5382 Chronic fatigue, unspecified: Secondary | ICD-10-CM

## 2016-07-26 DIAGNOSIS — G43709 Chronic migraine without aura, not intractable, without status migrainosus: Secondary | ICD-10-CM

## 2016-07-26 DIAGNOSIS — G4710 Hypersomnia, unspecified: Secondary | ICD-10-CM | POA: Diagnosis not present

## 2016-07-26 DIAGNOSIS — IMO0002 Reserved for concepts with insufficient information to code with codable children: Secondary | ICD-10-CM

## 2016-07-26 MED ORDER — PROPRANOLOL HCL 40 MG PO TABS: 40.0000 mg | ORAL_TABLET | Freq: Two times a day (BID) | ORAL | 11 refills | Status: DC

## 2016-07-26 MED ORDER — SUMATRIPTAN SUCCINATE 50 MG PO TABS: 50.0000 mg | ORAL_TABLET | ORAL | 6 refills | Status: DC | PRN

## 2016-07-26 NOTE — Patient Instructions (Signed)
Magnesium oxide 400 mg twice a day Riboflavin  100 mg twice a day 

## 2016-07-26 NOTE — Progress Notes (Signed)
PATIENT: Pam Diaz DOB: 05/02/1966  Chief Complaint  Patient presents with  . Migraine    She went to her eye doctor on 07/17/16 with reports of a headache for two weeks.  She was sent to urgent care and then referred to the ED.  She underwent normal scans, declined pain medication and was discharged.  She has continued to have a daily headache with varying degrees of pain. She has had intermittent episodes of nausea and dizziness.  She has been taking ibuprofen for pain management.     HISTORICAL  Pam Diaz 50 years old right-handed female, seen in refer by emergency room for evaluation of severe headache on July 17 2016  She denied previous history headache, August 20 second 2017, when she went for her normal yearly checkup, she was found to have elevated blood pressure 150 over nineties, she was sent to urgent care, later to emergency room with her reported 2 weeks long history of moderate recurrent headaches, and elevated blood pressure  I have personally reviewed workup that emergency room, CT head without contrast was normal, CT angiogram of head and neck was normal,  Laboratory evaluation showed normal CBC, CMP, negative troponin,  This is the first round of prolonged headaches since 2017, at the beginning of the year, she also complained of 2 weeks history of moderate headaches gradually resolved,  Currently she is taking ibuprofen 3 tablets every 8 hours for pain control, continue have mild to moderate bilateral frontal retro-orbital area headache movement made it worse, there was no significant light noise sensitivity.  She had a history of hysterectomy, recent few years noticed frequent hot flash,   REVIEW OF SYSTEMS: Full 14 system review of systems performed and notable only for headaches, insomnia, numbness, ringing ears ALLERGIES: No Known Allergies  HOME MEDICATIONS: Current Outpatient Prescriptions  Medication Sig Dispense Refill  . ibuprofen  (ADVIL,MOTRIN) 600 MG tablet Take 1 tablet (600 mg total) by mouth every 6 (six) hours as needed. 30 tablet 0   No current facility-administered medications for this visit.     PAST MEDICAL HISTORY: Past Medical History:  Diagnosis Date  . Headache     PAST SURGICAL HISTORY: Past Surgical History:  Procedure Laterality Date  . ABDOMINAL HYSTERECTOMY      FAMILY HISTORY: Family History  Problem Relation Age of Onset  . Healthy Mother   . Healthy Father     SOCIAL HISTORY:  Social History   Social History  . Marital status: Married    Spouse name: N/A  . Number of children: 3  . Years of education: Bachelors   Occupational History  . Accountant    Social History Main Topics  . Smoking status: Never Smoker  . Smokeless tobacco: Never Used  . Alcohol use No  . Drug use: No  . Sexual activity: Not on file   Other Topics Concern  . Not on file   Social History Narrative   Lives at home at with her husband and children.   Right-handed.   Occasional caffeine use.     PHYSICAL EXAM   Vitals:   07/26/16 1342  BP: 140/90  Pulse: 73  Weight: 202 lb (91.6 kg)  Height: 5\' 7"  (1.702 m)    Not recorded      Body mass index is 31.64 kg/m.  PHYSICAL EXAMNIATION:  Gen: NAD, conversant, well nourised, obese, well groomed  Cardiovascular: Regular rate rhythm, no peripheral edema, warm, nontender. Eyes: Conjunctivae clear without exudates or hemorrhage Neck: Supple, no carotid bruise. Pulmonary: Clear to auscultation bilaterally   NEUROLOGICAL EXAM:  MENTAL STATUS: Speech:    Speech is normal; fluent and spontaneous with normal comprehension.  Cognition:     Orientation to time, place and person     Normal recent and remote memory     Normal Attention span and concentration     Normal Language, naming, repeating,spontaneous speech     Fund of knowledge   CRANIAL NERVES: CN II: Visual fields are full to confrontation. Fundoscopic  exam is normal with sharp discs and no vascular changes. Pupils are round equal and briskly reactive to light. CN III, IV, VI: extraocular movement are normal. No ptosis. CN V: Facial sensation is intact to pinprick in all 3 divisions bilaterally. Corneal responses are intact.  CN VII: Face is symmetric with normal eye closure and smile. CN VIII: Hearing is normal to rubbing fingers CN IX, X: Palate elevates symmetrically. Phonation is normal. CN XI: Head turning and shoulder shrug are intact CN XII: Tongue is midline with normal movements and no atrophy.  MOTOR: There is no pronator drift of out-stretched arms. Muscle bulk and tone are normal. Muscle strength is normal.  REFLEXES: Reflexes are 2+ and symmetric at the biceps, triceps, knees, and ankles. Plantar responses are flexor.  SENSORY: Intact to light touch, pinprick, positional sensation and vibratory sensation are intact in fingers and toes.  COORDINATION: Rapid alternating movements and fine finger movements are intact. There is no dysmetria on finger-to-nose and heel-knee-shin.    GAIT/STANCE: Posture is normal. Gait is steady with normal steps, base, arm swing, and turning. Heel and toe walking are normal. Tandem gait is normal.  Romberg is absent.   DIAGNOSTIC DATA (LABS, IMAGING, TESTING) - I reviewed patient records, labs, notes, testing and imaging myself where available.   ASSESSMENT AND PLAN  Pam Diaz is a 50 y.o. female   Chronic migraine headaches  Start preventive medications propranolol 40 mg twice a day  Imitrex 50 mg as needed  Start daily ibuprofen use to avoid medicine rebound headache  Return to clinic in 2-3 months   Levert Feinstein, M.D. Ph.D.  [facility] [address], [address] [place], [place] [address] Ph: [phone] Fax: [phone]  CC: Referring Provider

## 2016-07-27 ENCOUNTER — Ambulatory Visit: Payer: Self-pay | Admitting: Neurology

## 2016-10-02 ENCOUNTER — Encounter: Payer: Self-pay | Admitting: Gastroenterology

## 2016-10-15 ENCOUNTER — Other Ambulatory Visit: Payer: Self-pay | Admitting: *Deleted

## 2016-10-15 MED ORDER — SUMATRIPTAN SUCCINATE 50 MG PO TABS: ORAL_TABLET | ORAL | 3 refills | Status: DC

## 2016-10-15 MED ORDER — PROPRANOLOL HCL 40 MG PO TABS: 40.0000 mg | ORAL_TABLET | Freq: Two times a day (BID) | ORAL | 3 refills | Status: DC

## 2016-10-23 ENCOUNTER — Ambulatory Visit: Payer: 59 | Admitting: Neurology

## 2017-08-29 ENCOUNTER — Other Ambulatory Visit: Payer: Self-pay | Admitting: Neurology

## 2017-12-04 ENCOUNTER — Ambulatory Visit: Payer: 59 | Admitting: Nurse Practitioner

## 2017-12-04 ENCOUNTER — Encounter: Payer: Self-pay | Admitting: Nurse Practitioner

## 2017-12-04 ENCOUNTER — Other Ambulatory Visit: Payer: Self-pay | Admitting: Neurology

## 2017-12-04 ENCOUNTER — Encounter (INDEPENDENT_AMBULATORY_CARE_PROVIDER_SITE_OTHER): Payer: Self-pay

## 2017-12-04 DIAGNOSIS — G43809 Other migraine, not intractable, without status migrainosus: Secondary | ICD-10-CM

## 2017-12-04 DIAGNOSIS — G43909 Migraine, unspecified, not intractable, without status migrainosus: Secondary | ICD-10-CM | POA: Insufficient documentation

## 2017-12-04 MED ORDER — PROPRANOLOL HCL 40 MG PO TABS: 40.0000 mg | ORAL_TABLET | Freq: Two times a day (BID) | ORAL | 0 refills | Status: DC

## 2017-12-04 MED ORDER — SUMATRIPTAN SUCCINATE 50 MG PO TABS
ORAL_TABLET | ORAL | 3 refills | Status: AC
Start: 2017-12-04 — End: ?

## 2017-12-04 NOTE — Patient Instructions (Addendum)
Continue propanolol 40 mg twice a day will refill locally as patient is almost out Continue Imitrex 50 mg as needed Given list of foods that are migraine triggers and reviewed these Call for  increase in headaches Follow-up yearly and as needed

## 2017-12-04 NOTE — Progress Notes (Signed)
[facility]  PATIENT: Pam Diaz DOB: 1966/08/30   REASON FOR VISIT: Follow-up for migraine HISTORY FROM: Patient    HISTORY OF PRESENT ILLNESS:UPDATE 1/9/2019CM Pam Diaz, 52 year old female returns for follow-up with history of migraines.  She was placed on propanolol with excellent results.  She has had 4 headaches since last seen and these were due to to attempting to titrate off of her medications.  She uses Imitrex acutely.  She is not aware of any specific foods that cause problems.  She has gained about 14 pounds since last seen.  She does not get any regular exercise no new neurologic complaints.  She returns for reevaluation  07/26/16 Pam Diaz 52 years old right-handed female, seen in refer by emergency room for evaluation of severe headache on July 17 2016  She denied previous history headache, August 20 second 2017, when she went for her normal yearly checkup, she was found to have elevated blood pressure 150 over nineties, she was sent to urgent care, later to emergency room with her reported 2 weeks long history of moderate recurrent headaches, and elevated blood pressure  I have personally reviewed workup that emergency room, CT head without contrast was normal, CT angiogram of head and neck was normal,  Laboratory evaluation showed normal CBC, CMP, negative troponin,  This is the first round of prolonged headaches since 2017, at the beginning of the year, she also complained of 2 weeks history of moderate headaches gradually resolved,  Currently she is taking ibuprofen 3 tablets every 8 hours for pain control, continue have mild to moderate bilateral frontal retro-orbital area headache movement made it worse, there was no significant light noise sensitivity.  She had a history of hysterectomy, recent few years noticed frequent hot flash,     REVIEW OF SYSTEMS: Full 14 system review of systems performed and notable only for  those listed, all others are neg:  Constitutional: neg  Cardiovascular: neg Ear/Nose/Throat: neg  Skin: neg Eyes: neg Respiratory: neg Gastroitestinal: neg  Hematology/Lymphatic: neg  Endocrine: neg Musculoskeletal:neg Allergy/Immunology: neg Neurological: History of migraine  Psychiatric: neg Sleep : neg   ALLERGIES: No Known Allergies  HOME MEDICATIONS: Outpatient Medications Prior to Visit  Medication Sig Dispense Refill  . propranolol (INDERAL) 40 MG tablet Take 1 tablet (40 mg total) by mouth 2 (two) times daily. 180 tablet 3  . SUMAtriptan (IMITREX) 50 MG tablet Take at onset of migraine.  May repeat in 2hrs, prn.  Max: 2 tabs/24hrs. 30 tablet 3  . ibuprofen (ADVIL,MOTRIN) 600 MG tablet Take 1 tablet (600 mg total) by mouth every 6 (six) hours as needed. 30 tablet 0   No facility-administered medications prior to visit.     PAST MEDICAL HISTORY: Past Medical History:  Diagnosis Date  . Headache     PAST SURGICAL HISTORY: Past Surgical History:  Procedure Laterality Date  . ABDOMINAL HYSTERECTOMY      FAMILY HISTORY: Family History  Problem Relation Age of Onset  . Healthy Mother   . Healthy Father     SOCIAL HISTORY: Social History   Socioeconomic History  . Marital status: Married    Spouse name: Not on file  . Number of children: 3  . Years of education: Bachelors  . Highest education level: Not on file  Social Needs  . Financial resource strain: Not on file  . Food insecurity - worry: Not on file  . Food insecurity - inability: Not on file  . Transportation needs -  medical: Not on file  . Transportation needs - non-medical: Not on file  Occupational History  . Occupation: Airline pilotAccountant  Tobacco Use  . Smoking status: Never Smoker  . Smokeless tobacco: Never Used  Substance and Sexual Activity  . Alcohol use: No  . Drug use: No  . Sexual activity: Not on file  Other Topics Concern  . Not on file  Social History Narrative   Lives at home  at with her husband and children.   Right-handed.   Occasional caffeine use.     PHYSICAL EXAM  Vitals:   12/04/17 1516  BP: 138/75  Pulse: 65  Weight: 216 lb (98 kg)   Body mass index is 33.83 kg/m.  Generalized: Well developed, obese female in no acute distress  Head: normocephalic and atraumatic,. Oropharynx benign  Neck: Supple,  Musculoskeletal: No deformity   Neurological examination   Mentation: Alert oriented to time, place, history taking. Attention span and concentration appropriate. Recent and remote memory intact.  Follows all commands speech and language fluent.   Cranial nerve II-XII: Pupils were equal round reactive to light extraocular movements were full, visual field were full on confrontational test. Facial sensation and strength were normal. hearing was intact to finger rubbing bilaterally. Uvula tongue midline. head turning and shoulder shrug were normal and symmetric.Tongue protrusion into cheek strength was normal. Motor: normal bulk and tone, full strength in the BUE, BLE, fine finger movements normal, no pronator drift. No focal weakness Sensory: normal and symmetric to light touch,  Coordination: finger-nose-finger, heel-to-shin bilaterally, no dysmetria Reflexes: Brachioradialis 2/2, biceps 2/2, triceps 2/2, patellar 2/2, Achilles 2/2, plantar responses were flexor bilaterally. Gait and Station: Rising up from seated position without assistance, normal stance,  moderate stride, good arm swing, smooth turning, able to perform tiptoe, and heel walking without difficulty. Tandem gait is steady  DIAGNOSTIC DATA (LABS, IMAGING, TESTING) - I reviewed patient records, labs, notes, testing and imaging myself where available.  Lab Results  Component Value Date   WBC 8.2 07/17/2016   HGB 15.0 07/17/2016   HCT 44.0 07/17/2016   MCV 89.4 07/17/2016   PLT 245 07/17/2016      Component Value Date/Time   NA 142 07/17/2016 1418   K 4.7 07/17/2016 1418   CL  104 07/17/2016 1418   CO2 26 07/17/2016 1407   GLUCOSE 101 (H) 07/17/2016 1418   BUN 12 07/17/2016 1418   CREATININE 0.90 07/17/2016 1418   CALCIUM 10.2 07/17/2016 1407   PROT 8.1 07/17/2016 1407   ALBUMIN 4.7 07/17/2016 1407   AST 23 07/17/2016 1407   ALT 32 07/17/2016 1407   ALKPHOS 85 07/17/2016 1407   BILITOT 0.8 07/17/2016 1407   GFRNONAA >60 07/17/2016 1407   GFRAA >60 07/17/2016 1407    ASSESSMENT AND PLAN   Pam HesselbachKimberly A Diaz is a 52 y.o. female  here to follow-up for chronic migraine headaches.  She is doing well on propanolol 40 mg twice a day as preventive and Imitrex acutely.  She has tried to taper her medications in the last year and her headaches returned.   PLAN: Continue propanolol 40 mg twice a day will refill locally as patient is almost out Continue Imitrex 50 mg as needed Given list of foods that are migraine triggers and reviewed these Call for  increase in headaches Follow-up yearly and as needed Nilda RiggsNancy Carolyn Martin, [facility] CenterGNP, [facility] HospitalBC, APRN  [facility] [address], [address] [place], [place] [address] [phone]

## 2017-12-05 ENCOUNTER — Other Ambulatory Visit: Payer: Self-pay | Admitting: Nurse Practitioner

## 2017-12-05 MED ORDER — PROPRANOLOL HCL 40 MG PO TABS: 40.0000 mg | ORAL_TABLET | Freq: Two times a day (BID) | ORAL | 3 refills | Status: DC

## 2017-12-05 NOTE — Progress Notes (Signed)
I have reviewed and agreed above plan. 

## 2017-12-11 ENCOUNTER — Telehealth: Payer: Self-pay | Admitting: Nurse Practitioner

## 2017-12-11 MED ORDER — PROPRANOLOL HCL 40 MG PO TABS: 40.0000 mg | ORAL_TABLET | Freq: Two times a day (BID) | ORAL | 3 refills | Status: AC

## 2017-12-11 NOTE — Telephone Encounter (Signed)
Pt called wanting to make sure a year supply of propranolol (INDERAL) 40 MG tablet has been sent [facility]  [facility] [place] [facility] [phone] [place]

## 2017-12-11 NOTE — Telephone Encounter (Signed)
Called patient to inform her Rx was sent, receipt confirmed on 12/04/17. She stated [facility] told her it wasn't received. Advised patient will d/c previous [facility]. She verbalized understanding, appreciation.

## 2018-04-17 IMAGING — CT CT ANGIO HEAD
2 of 8 series · 9 of 33 positions shown · IV contrast (OMNI 350)
Comparison: CT head 07/17/2016.

CLINICAL DATA: 50 y/o F; severe right posterior headache radiating
to the front. High blood pressure.

EXAM:
CT ANGIOGRAPHY HEAD AND NECK
TECHNIQUE: Multidetector CT imaging of the head and neck was performed using
the standard protocol during bolus administration of intravenous
contrast. Multiplanar CT image reconstructions and MIPs were
obtained to evaluate the vascular anatomy. Carotid stenosis
measurements (when applicable) are obtained utilizing NASCET
criteria, using the distal internal carotid diameter as the
denominator.
CONTRAST:  50 cc Isovue 370

[Series 5: cta neck · axial · 0.44mm/px · z∈[-331,+9]mm · 3 of 171 slices shown]
[im 1/171  soft-tissue]
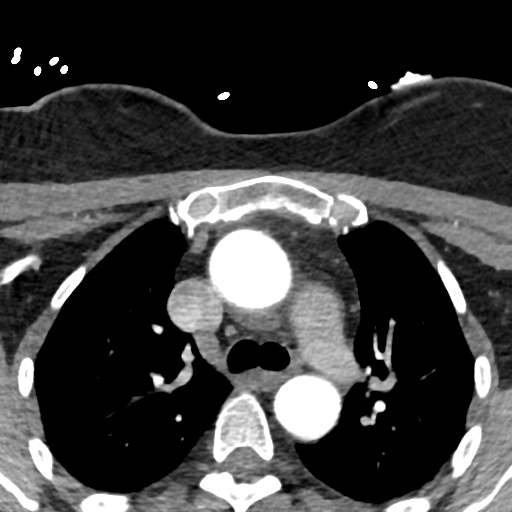
[im 86/171  bone]
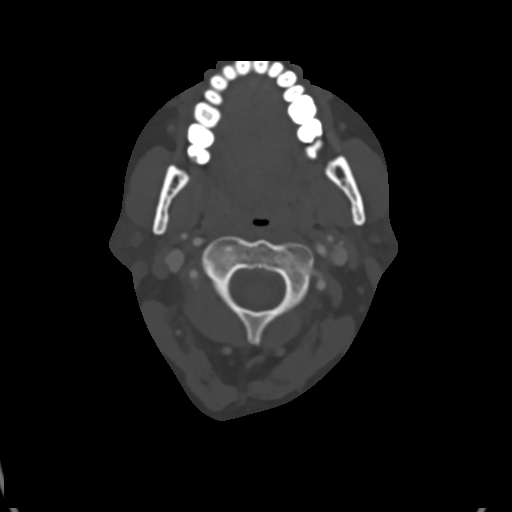
[im 171/171  soft-tissue]
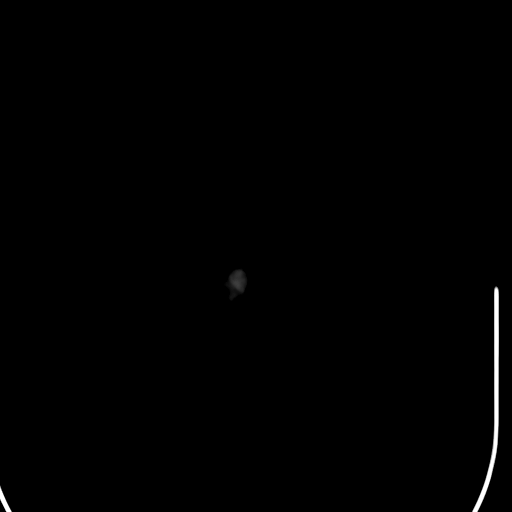

[Series 7: cta neck axial · axial · 0.39mm/px · z∈[-285,-43]mm · 6 of 340 slices shown]
[im 49/340  soft-tissue]
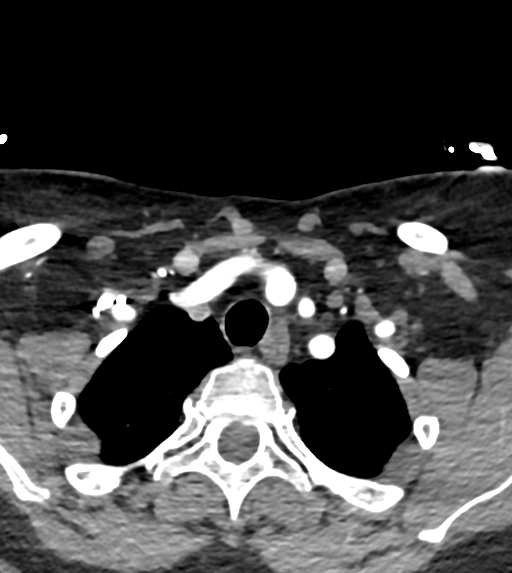
[im 97/340  soft-tissue]
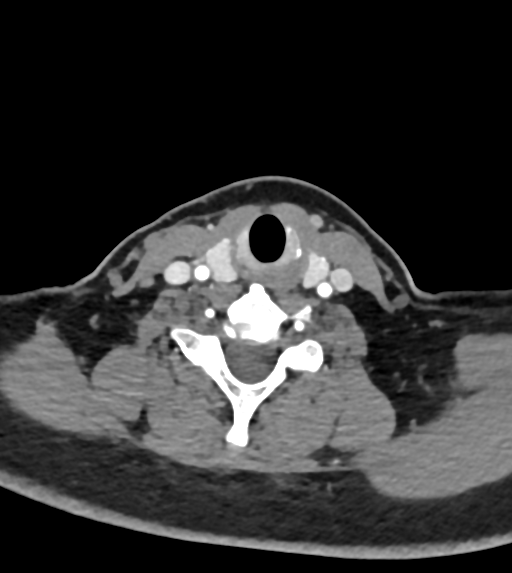
[im 146/340  soft-tissue]
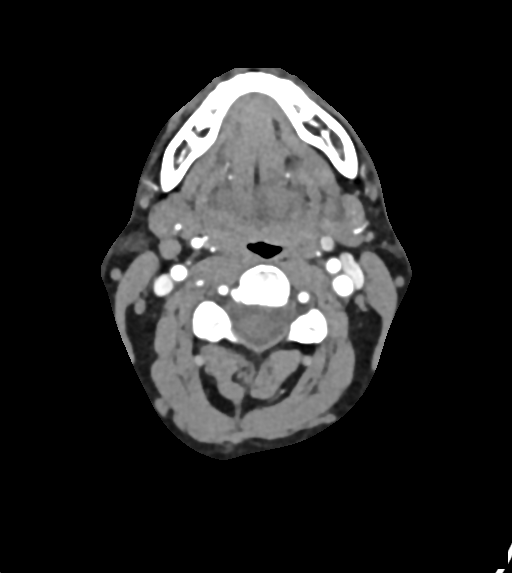
[im 194/340  soft-tissue]
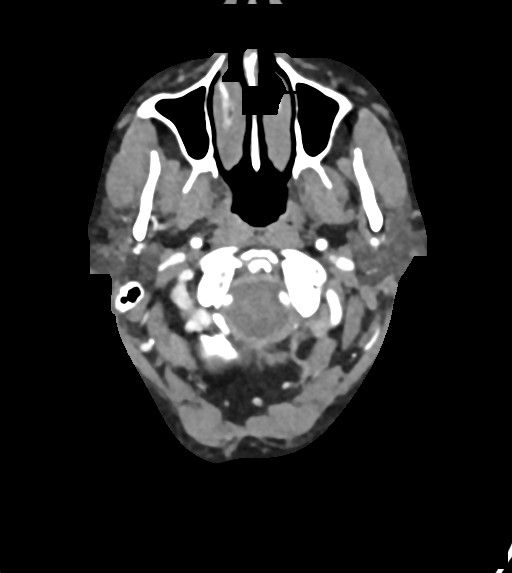
[im 243/340  soft-tissue]
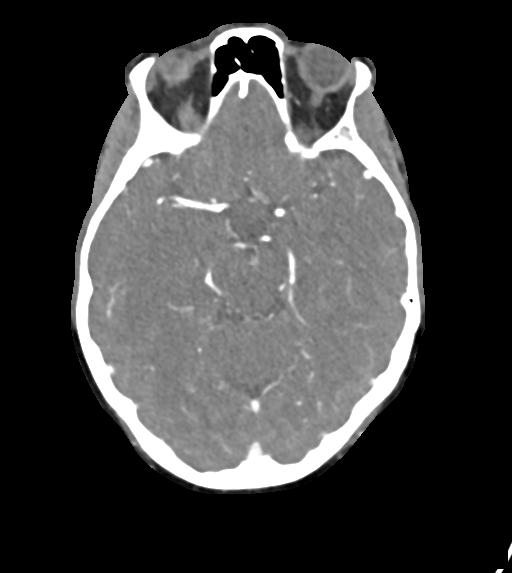
[im 291/340  soft-tissue]
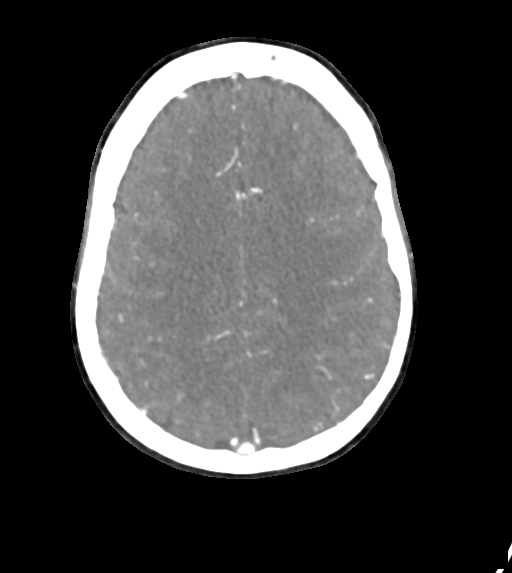

[9 of 33 positions shown; findings below may reference images not displayed]

FINDINGS: CTA NECK

Aortic arch: 3 vessel arch.  Patent great vessels.

Right carotid system: No occlusion, aneurysm, dissection, or
significant stenosis is identified.

Left carotid system: No occlusion, aneurysm, dissection, or
significant stenosis is identified.

Vertebral arteries:No occlusion, aneurysm, dissection, or
significant stenosis is identified.

Skeleton: No acute osseous abnormality is identified.

Other neck: Salivary glands are unremarkable. No lymphadenopathy or
discrete cervical mass is identified. The thyroid gland is normal.
The aerodigestive tract is patent.

Upper chest: Clear lung apices.

CTA HEAD

Anterior circulation: Bilateral internal carotid, anterior cerebral,
and middle cerebral arteries are patent. No occlusion, aneurysm,
dissection, or significant stenosis is identified.

Posterior circulation: Bilateral vertebral arteries, the basilar
artery, and posterior cerebral arteries are patent. No occlusion,
aneurysm, dissection, or significant stenosis is identified.

Venous sinuses: No thrombosis is identified.

Anatomic variants: Small anterior communicating artery. No posterior
communicating arteries identified, hypoplastic or absent.

Delayed phase: No abnormal enhancement.
IMPRESSION: Normal CT angiogram of head and neck. No occlusion, aneurysm,
dissection, or significant stenosis is identified.

By: Ara Locklear M.D.

## 2019-09-07 ENCOUNTER — Other Ambulatory Visit: Payer: Self-pay

## 2019-09-07 DIAGNOSIS — Z20822 Contact with and (suspected) exposure to covid-19: Secondary | ICD-10-CM

## 2019-09-08 LAB — NOVEL CORONAVIRUS, NAA: SARS-CoV-2, NAA: NOT DETECTED
